# Patient Record
Sex: Female | Born: 1970 | Race: White | Hispanic: No | Marital: Married | Smoking: Never smoker
Health system: Southern US, Community
[De-identification: ages and names within clinical notes are randomized; demographics above are authoritative.]

## PROBLEM LIST (undated history)

## (undated) HISTORY — PX: OTHER SURGICAL HISTORY: SHX169

## (undated) HISTORY — PX: APPENDECTOMY: SHX54

---

## 2014-06-15 ENCOUNTER — Other Ambulatory Visit (HOSPITAL_COMMUNITY): Payer: Self-pay | Admitting: *Deleted

## 2014-06-15 DIAGNOSIS — N6310 Unspecified lump in the right breast, unspecified quadrant: Secondary | ICD-10-CM

## 2014-07-06 ENCOUNTER — Encounter (HOSPITAL_COMMUNITY): Payer: Self-pay

## 2014-07-06 ENCOUNTER — Ambulatory Visit: Payer: Self-pay

## 2014-07-06 ENCOUNTER — Ambulatory Visit (HOSPITAL_COMMUNITY)
Admission: RE | Admit: 2014-07-06 | Discharge: 2014-07-06 | Disposition: A | Discharge status: Home / Self Care | Payer: Self-pay | Source: Ambulatory Visit | Attending: Obstetrics and Gynecology | Admitting: Obstetrics and Gynecology

## 2014-07-06 VITALS — BP 118/76 | Ht 68.0 in | Wt 196.0 lb

## 2014-07-06 DIAGNOSIS — N6322 Unspecified lump in the left breast, upper inner quadrant: Secondary | ICD-10-CM

## 2014-07-06 DIAGNOSIS — Z01419 Encounter for gynecological examination (general) (routine) without abnormal findings: Secondary | ICD-10-CM

## 2014-07-06 DIAGNOSIS — N898 Other specified noninflammatory disorders of vagina: Secondary | ICD-10-CM

## 2014-07-06 NOTE — Progress Notes (Signed)
Complaints of left breast lump x 4 months and breast pain around menstrual cycle.   Pap Smear: Completed Pap smear today. Last Pap smear was 13 years ago and normal per patient. Per patient has no history of an abnormal Pap smear. No Pap smear results in EPIC.  Physical exam: Breasts Breasts symmetrical. No skin abnormalities bilateral breasts. No nipple retraction bilateral breasts. No nipple discharge bilateral breasts. No lymphadenopathy right breast. Left axillary lymphadenopathy. No lumps palpated right breast. Palpated a thickened area versus lump within the left breast at 11 o'clock 7 cm from the nipple. Complaints of left inner breast tenderness on exam. [facility] and possible left breast ultrasound. Appointment scheduled for Thursday July 20, 2014 at 1115.          Pelvic/Bimanual   Ext Genitalia No lesions, no swelling and no discharge observed on external genitalia.         Vagina Vagina pink and normal texture. No lesions and thick white cottage cheese appearing discharge observed in vagina. Wet prep completed.          Cervix Cervix is present. Cervix pink and of normal texture. Thick white cottage cheese appearing discharge observed on cervix.    Uterus Uterus is present and palpable. Uterus in normal position and normal size.       Adnexae Bilateral ovaries present and palpable. No tenderness on palpation.        Rectovaginal No rectal exam completed today since patient had no rectal complaints. No skin abnormalities observed on exam.

## 2014-07-06 NOTE — Patient Instructions (Signed)
Explained to Mike GipStephanie Gary that [facility] will cover Pap smears and co-testing every 5 years unless has a history of abnormal Pap smears. [facility] and possible left breast ultrasound. Appointment scheduled for Thursday July 20, 2014 at 1115. Patient aware of appointment and will be there. Let patient know will follow up with her within the next couple weeks with results of Pap smear and wet prep by phone. Mike GipStephanie Scrivens verbalized understanding.

## 2014-07-06 NOTE — Progress Notes (Signed)
Patient ID: Beverly GipStephanie Shieh, female   DOB: 04-16-1971, 43 y.o.   MRN: [id number] Patient uses [facility] in [place] phone is [phone]

## 2014-07-06 NOTE — Addendum Note (Signed)
Encounter addended by: Priscille Heidelberghristine P Karlin Heilman, RN on: 07/06/2014  3:55 PM<BR>     Documentation filed: Visit Diagnoses

## 2014-07-06 NOTE — Addendum Note (Signed)
Encounter addended by: Priscille Heidelberghristine P Brannock, RN on: 07/06/2014  3:52 PM<BR>     Documentation filed: Visit Diagnoses

## 2014-07-06 NOTE — Addendum Note (Signed)
Encounter addended by: Lurlean HornsSabrina Raymar Joiner Holland, LPN on: 82/95/621311/19/2015  4:03 PM<BR>     Documentation filed: [employer], Orders

## 2014-07-06 NOTE — Addendum Note (Signed)
Encounter addended by: Priscille Heidelberghristine P Shevaun Lovan, RN on: 07/06/2014 11:58 AM<BR>     Documentation filed: Charges VN

## 2014-07-10 LAB — CYTOLOGY - PAP

## 2014-07-11 ENCOUNTER — Other Ambulatory Visit (HOSPITAL_COMMUNITY): Payer: Self-pay | Admitting: *Deleted

## 2014-07-11 ENCOUNTER — Telehealth (HOSPITAL_COMMUNITY): Payer: Self-pay | Admitting: *Deleted

## 2014-07-11 DIAGNOSIS — B379 Candidiasis, unspecified: Secondary | ICD-10-CM

## 2014-07-11 MED ORDER — FLUCONAZOLE 150 MG PO TABS: 150.0000 mg | ORAL_TABLET | Freq: Once | ORAL | Status: AC

## 2014-07-11 NOTE — Telephone Encounter (Signed)
Patient returned called. Advised patient that Diflucan was on backorder and would need to call medication into another pharmacy. Patient prefers to try OTC medication. Advised patient could try monistat if this doesn't work would be more than glad to call in diflucan. Patient voiced understanding and will call be if doesn't work.

## 2014-07-11 NOTE — Telephone Encounter (Signed)
Telephoned patient at home # and discussed normal pap smear results. HPV was negative. Advised patient wet prep did show yeast. Tried to call medication into [facility] at [phone]. Medication is on back order. Tried to call patient back and left message to return call to [facility].

## 2014-07-20 ENCOUNTER — Ambulatory Visit
Admission: RE | Admit: 2014-07-20 | Discharge: 2014-07-20 | Disposition: A | Discharge status: Home / Self Care | Payer: No Typology Code available for payment source | Source: Ambulatory Visit | Attending: Obstetrics and Gynecology | Admitting: Obstetrics and Gynecology

## 2014-07-20 ENCOUNTER — Other Ambulatory Visit (HOSPITAL_COMMUNITY): Payer: Self-pay | Admitting: Obstetrics and Gynecology

## 2014-07-20 DIAGNOSIS — N6310 Unspecified lump in the right breast, unspecified quadrant: Secondary | ICD-10-CM

## 2014-07-25 ENCOUNTER — Encounter (HOSPITAL_COMMUNITY): Payer: Self-pay | Admitting: *Deleted

## 2015-11-03 IMAGING — US US BREAST LTD UNI LEFT INC AXILLA
1 series · 7 of 7 positions shown · non-contrast
Comparison: None.

CLINICAL DATA: 43-year-old with a palpable left breast mass

EXAM:
DIGITAL DIAGNOSTIC  BILATERAL MAMMOGRAM WITH CAD
ULTRASOUND LEFT BREAST

[Series 1: us breast ltd uni left inc axilla · 7 of 7 slices shown]
[im 1/7]
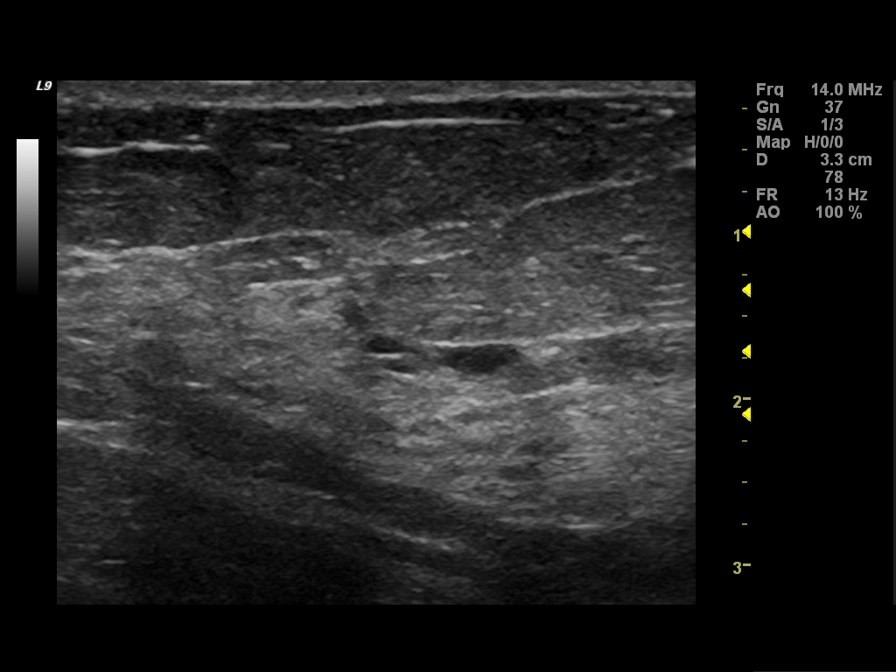
[im 2/7]
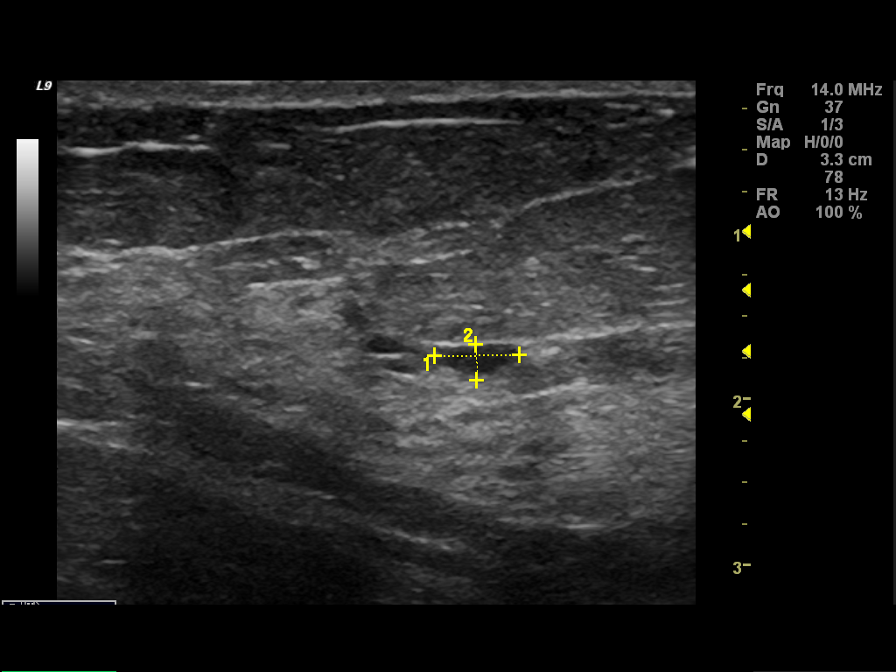
[im 3/7]
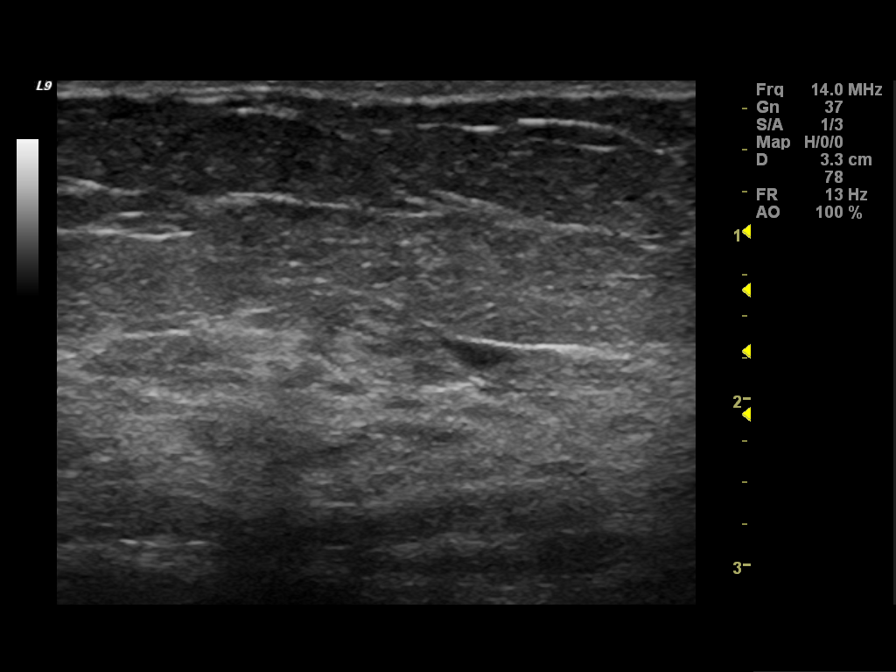
[im 4/7]
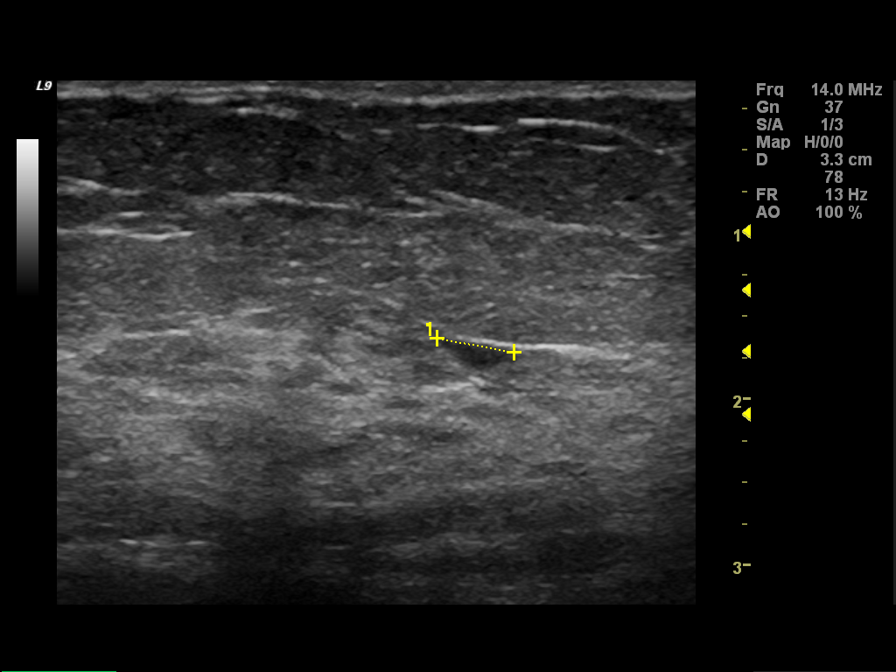
[im 5/7]
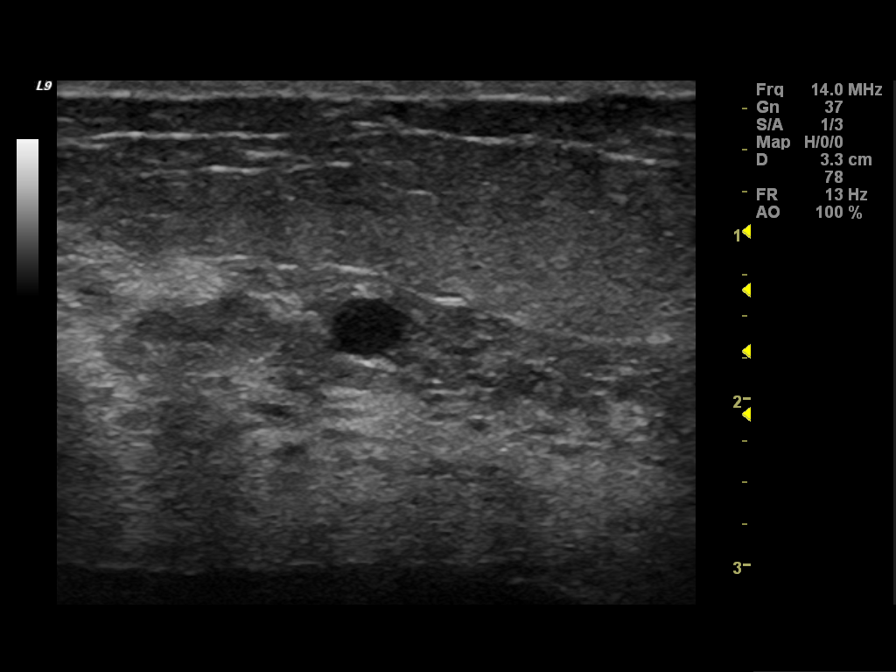
[im 6/7]
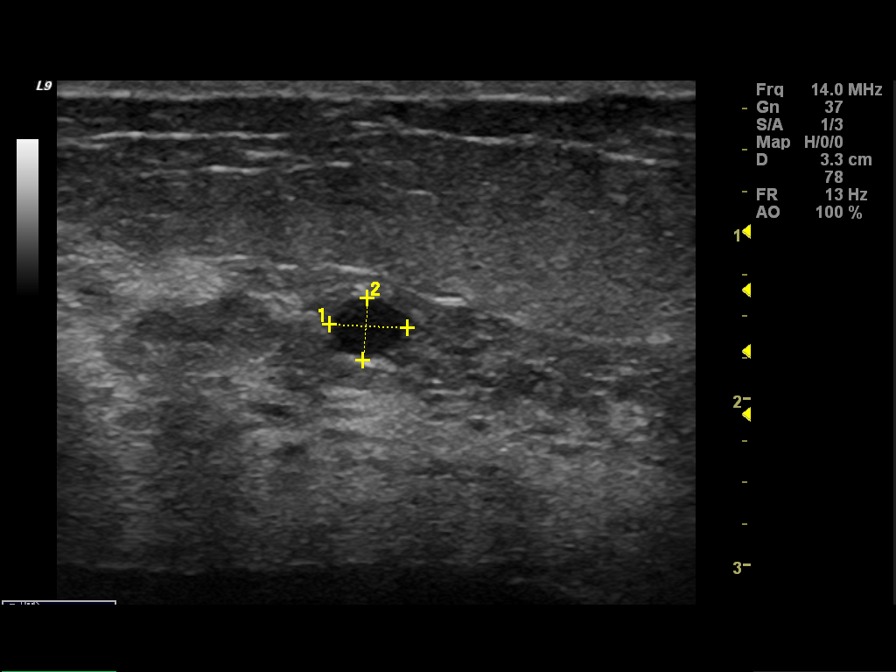
[im 7/7]
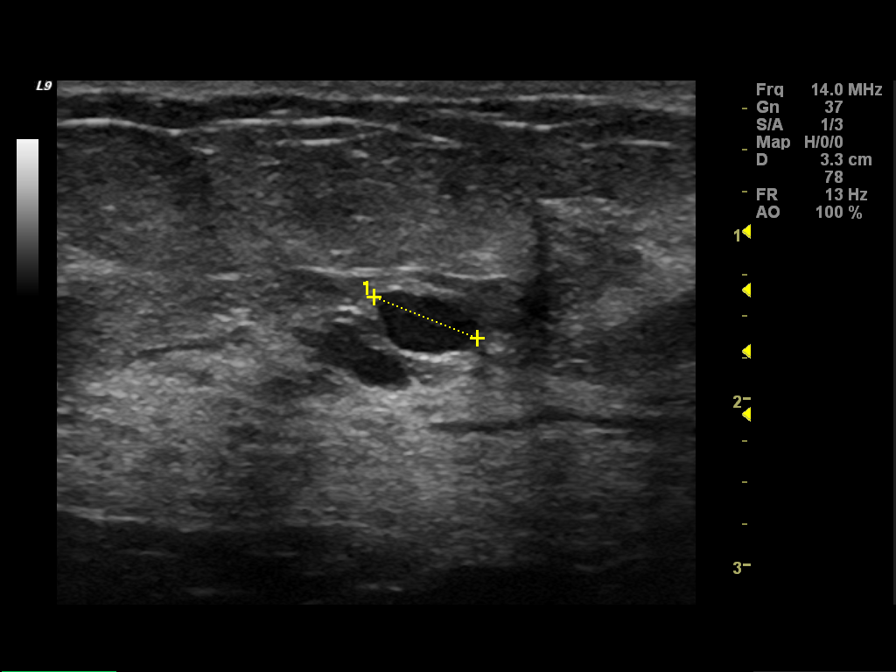

[7 of 7 positions shown; findings below may reference images not displayed]

ACR Breast Density Category c: The breast tissue is heterogeneously
dense, which may obscure small masses.
FINDINGS: A metallic BB overlies a palpable abnormality in the approximately
10 o'clock position of the left breast. Focally dense breast tissue
is present. No definite discrete mass, distortion or calcification
is seen at the site of the left breast palpable abnormality. The
right breast is without suspicious mass, architectural distortion or
malignant type calcifications.

Mammographic images were processed with CAD.

On physical exam, no definite mass is palpated. Focally
prominent/dense breast tissue is noted at the site of the left
breast palpable abnormality.

Ultrasound is performed, showing in the 10 o'clock position of the
left breast, 6 cm from the nipple there is a 0.5 x 0.2 x 0.5 cm
parallel, hypoechoic mass which corresponds to the patient's
palpable abnormality. This finding likely represents a cyst or
focally prominent duct. In the 12 o'clock position of the subareolar
left breast there is a 0.5 x 0.4 x 0.7 cm parallel hypoechoic mass
with associated through transmission, this finding is compatible
with a cyst.
IMPRESSION: Palpable left breast mass corresponds to benign findings as above.

RECOMMENDATION:
Recommend normal annual screening mammograms. The patient should
return sooner if clinically indicated.

I have discussed the findings and recommendations with the patient.
Results were also provided in writing at the conclusion of the
visit. If applicable, a reminder letter will be sent to the patient
regarding the next appointment.

BI-RADS CATEGORY  2: Benign.
